# Patient Record
Sex: Male | Born: 1990 | Race: White | Hispanic: No | Marital: Single | Smoking: Former smoker
Health system: Southern US, Community
[De-identification: ages and names within clinical notes are randomized; demographics above are authoritative.]

## PROBLEM LIST (undated history)

## (undated) DIAGNOSIS — M109 Gout, unspecified: Secondary | ICD-10-CM

## (undated) HISTORY — DX: Gout, unspecified: M10.9

---

## 2015-05-01 DIAGNOSIS — M549 Dorsalgia, unspecified: Secondary | ICD-10-CM | POA: Insufficient documentation

## 2015-05-01 DIAGNOSIS — Z720 Tobacco use: Secondary | ICD-10-CM | POA: Insufficient documentation

## 2015-05-01 DIAGNOSIS — R1032 Left lower quadrant pain: Secondary | ICD-10-CM | POA: Diagnosis present

## 2015-05-01 DIAGNOSIS — N201 Calculus of ureter: Secondary | ICD-10-CM | POA: Insufficient documentation

## 2015-05-02 ENCOUNTER — Emergency Department (HOSPITAL_COMMUNITY): Payer: 59

## 2015-05-02 ENCOUNTER — Emergency Department (HOSPITAL_COMMUNITY)
Admission: EM | Admit: 2015-05-02 | Discharge: 2015-05-02 | Disposition: A | Discharge status: Home / Self Care | Payer: 59 | Attending: Emergency Medicine | Admitting: Emergency Medicine

## 2015-05-02 ENCOUNTER — Encounter (HOSPITAL_COMMUNITY): Payer: Self-pay | Admitting: *Deleted

## 2015-05-02 DIAGNOSIS — N201 Calculus of ureter: Secondary | ICD-10-CM

## 2015-05-02 LAB — URINALYSIS, ROUTINE W REFLEX MICROSCOPIC
Bilirubin Urine: NEGATIVE
GLUCOSE, UA: NEGATIVE mg/dL
Ketones, ur: 15 mg/dL — AB
LEUKOCYTES UA: NEGATIVE
Nitrite: NEGATIVE
PH: 5.5 (ref 5.0–8.0)
Protein, ur: NEGATIVE mg/dL
Specific Gravity, Urine: 1.02 (ref 1.005–1.030)
Urobilinogen, UA: 0.2 mg/dL (ref 0.0–1.0)

## 2015-05-02 LAB — URINE MICROSCOPIC-ADD ON

## 2015-05-02 LAB — CBC WITH DIFFERENTIAL/PLATELET
Basophils Absolute: 0 10*3/uL (ref 0.0–0.1)
Basophils Relative: 0 %
EOS PCT: 0 %
Eosinophils Absolute: 0 10*3/uL (ref 0.0–0.7)
HEMATOCRIT: 43.9 % (ref 39.0–52.0)
Hemoglobin: 15 g/dL (ref 13.0–17.0)
LYMPHS ABS: 1.5 10*3/uL (ref 0.7–4.0)
LYMPHS PCT: 17 %
MCH: 31.8 pg (ref 26.0–34.0)
MCHC: 34.2 g/dL (ref 30.0–36.0)
MCV: 93 fL (ref 78.0–100.0)
MONO ABS: 0.4 10*3/uL (ref 0.1–1.0)
MONOS PCT: 5 %
NEUTROS ABS: 6.9 10*3/uL (ref 1.7–7.7)
Neutrophils Relative %: 78 %
PLATELETS: 145 10*3/uL — AB (ref 150–400)
RBC: 4.72 MIL/uL (ref 4.22–5.81)
RDW: 12.6 % (ref 11.5–15.5)
WBC: 8.9 10*3/uL (ref 4.0–10.5)

## 2015-05-02 LAB — COMPREHENSIVE METABOLIC PANEL
ALBUMIN: 4.1 g/dL (ref 3.5–5.0)
ALT: 72 U/L — AB (ref 17–63)
AST: 63 U/L — AB (ref 15–41)
Alkaline Phosphatase: 43 U/L (ref 38–126)
Anion gap: 12 (ref 5–15)
BILIRUBIN TOTAL: 0.4 mg/dL (ref 0.3–1.2)
BUN: 9 mg/dL (ref 6–20)
CHLORIDE: 103 mmol/L (ref 101–111)
CO2: 24 mmol/L (ref 22–32)
CREATININE: 1.01 mg/dL (ref 0.61–1.24)
Calcium: 9.2 mg/dL (ref 8.9–10.3)
GFR calc Af Amer: >60 mL/min (ref >60)
GLUCOSE: 107 mg/dL — AB (ref 65–99)
POTASSIUM: 4.2 mmol/L (ref 3.5–5.1)
Sodium: 139 mmol/L (ref 135–145)
Total Protein: 7.4 g/dL (ref 6.5–8.1)

## 2015-05-02 LAB — LIPASE, BLOOD: LIPASE: 32 U/L (ref 11–51)

## 2015-05-02 MED ORDER — ONDANSETRON 8 MG PO TBDP: 8.0000 mg | ORAL_TABLET | Freq: Three times a day (TID) | ORAL | Status: DC | PRN

## 2015-05-02 MED ORDER — SODIUM CHLORIDE 0.9 % IV BOLUS (SEPSIS)
1000.0000 mL | Freq: Once | INTRAVENOUS | Status: AC
  Administered 2015-05-02: 1000 mL via INTRAVENOUS

## 2015-05-02 MED ORDER — OXYCODONE-ACETAMINOPHEN 5-325 MG PO TABS: 1.0000 | ORAL_TABLET | ORAL | Status: DC | PRN

## 2015-05-02 MED ORDER — TAMSULOSIN HCL 0.4 MG PO CAPS: 0.4000 mg | ORAL_CAPSULE | Freq: Every day | ORAL | Status: DC

## 2015-05-02 MED ORDER — KETOROLAC TROMETHAMINE 30 MG/ML IJ SOLN
30.0000 mg | Freq: Once | INTRAMUSCULAR | Status: AC
  Administered 2015-05-02: 30 mg via INTRAVENOUS
  Filled 2015-05-02: qty 1

## 2015-05-02 MED ORDER — ONDANSETRON HCL 4 MG/2ML IJ SOLN
4.0000 mg | Freq: Once | INTRAMUSCULAR | Status: AC
  Administered 2015-05-02: 4 mg via INTRAVENOUS
  Filled 2015-05-02: qty 2

## 2015-05-02 MED ORDER — HYDROMORPHONE HCL 1 MG/ML IJ SOLN
1.0000 mg | Freq: Once | INTRAMUSCULAR | Status: AC
  Administered 2015-05-02: 1 mg via INTRAVENOUS
  Filled 2015-05-02: qty 1

## 2015-05-02 NOTE — Discharge Instructions (Signed)
Kidney Stones Kidney stones (urolithiasis) are deposits that form inside your kidneys. The intense pain is caused by the stone moving through the urinary tract. When the stone moves, the ureter goes into spasm around the stone. The stone is usually passed in the urine.  CAUSES   A disorder that makes certain neck glands produce too much parathyroid hormone (primary hyperparathyroidism).  A buildup of uric acid crystals, similar to gout in your joints.  Narrowing (stricture) of the ureter.  A kidney obstruction present at birth (congenital obstruction).  Previous surgery on the kidney or ureters.  Numerous kidney infections. SYMPTOMS   Feeling sick to your stomach (nauseous).  Throwing up (vomiting).  Blood in the urine (hematuria).  Pain that usually spreads (radiates) to the groin.  Frequency or urgency of urination. DIAGNOSIS   Taking a history and physical exam.  Blood or urine tests.  CT scan.  Occasionally, an examination of the inside of the urinary bladder (cystoscopy) is performed. TREATMENT   Observation.  Increasing your fluid intake.  Extracorporeal shock wave lithotripsy--This is a noninvasive procedure that uses shock waves to break up kidney stones.  Surgery may be needed if you have severe pain or persistent obstruction. There are various surgical procedures. Most of the procedures are performed with the use of small instruments. Only small incisions are needed to accommodate these instruments, so recovery time is minimized. The size, location, and chemical composition are all important variables that will determine the proper choice of action for you. Talk to your health care provider to better understand your situation so that you will minimize the risk of injury to yourself and your kidney.  HOME CARE INSTRUCTIONS   Drink enough water and fluids to keep your urine clear or pale yellow. This will help you to pass the stone or stone fragments.  Strain  all urine through the provided strainer. Keep all particulate matter and stones for your health care provider to see. The stone causing the pain may be as small as a grain of salt. It is very important to use the strainer each and every time you pass your urine. The collection of your stone will allow your health care provider to analyze it and verify that a stone has actually passed. The stone analysis will often identify what you can do to reduce the incidence of recurrences.  Only take over-the-counter or prescription medicines for pain, discomfort, or fever as directed by your health care provider.  Keep all follow-up visits as told by your health care provider. This is important.  Get follow-up X-rays if required. The absence of pain does not always mean that the stone has passed. It may have only stopped moving. If the urine remains completely obstructed, it can cause loss of kidney function or even complete destruction of the kidney. It is your responsibility to make sure X-rays and follow-ups are completed. Ultrasounds of the kidney can show blockages and the status of the kidney. Ultrasounds are not associated with any radiation and can be performed easily in a matter of minutes.  Make changes to your daily diet as told by your health care provider. You may be told to:  Limit the amount of salt that you eat.  Eat 5 or more servings of fruits and vegetables each day.  Limit the amount of meat, poultry, fish, and eggs that you eat.  Collect a 24-hour urine sample as told by your health care provider.You may need to collect another urine sample every 6-12   months. SEEK MEDICAL CARE IF:  You experience pain that is progressive and unresponsive to any pain medicine you have been prescribed. SEEK IMMEDIATE MEDICAL CARE IF:   Pain cannot be controlled with the prescribed medicine.  You have a fever or shaking chills.  The severity or intensity of pain increases over 18 hours and is not  relieved by pain medicine.  You develop a new onset of abdominal pain.  You feel faint or pass out.  You are unable to urinate.   This information is not intended to replace advice given to you by your health care provider. Make sure you discuss any questions you have with your health care provider.   Document Released: 06/05/2005 Document Revised: 02/24/2015 Document Reviewed: 11/06/2012 Elsevier Interactive Patient Education 2016 [employer] Dietary Guidelines to Help Prevent Kidney Stones Your risk of kidney stones can be decreased by adjusting the foods you eat. The most important thing you can do is drink enough fluid. You should drink enough fluid to keep your urine clear or pale yellow. The following guidelines provide specific information for the type of kidney stone you have had. GUIDELINES ACCORDING TO TYPE OF KIDNEY STONE Calcium Oxalate Kidney Stones  Reduce the amount of salt you eat. Foods that have a lot of salt cause your body to release excess calcium into your urine. The excess calcium can combine with a substance called oxalate to form kidney stones.  Reduce the amount of animal protein you eat if the amount you eat is excessive. Animal protein causes your body to release excess calcium into your urine. Ask your dietitian how much protein from animal sources you should be eating.  Avoid foods that are high in oxalates. If you take vitamins, they should have less than 500 mg of vitamin C. Your body turns vitamin C into oxalates. You do not need to avoid fruits and vegetables high in vitamin C. Calcium Phosphate Kidney Stones  Reduce the amount of salt you eat to help prevent the release of excess calcium into your urine.  Reduce the amount of animal protein you eat if the amount you eat is excessive. Animal protein causes your body to release excess calcium into your urine. Ask your dietitian how much protein from animal sources you should be eating.  Get enough  calcium from food or take a calcium supplement (ask your dietitian for recommendations). Food sources of calcium that do not increase your risk of kidney stones include:  Broccoli.  Dairy products, such as cheese and yogurt.  Pudding. Uric Acid Kidney Stones  Do not have more than 6 oz of animal protein per day. FOOD SOURCES Animal Protein Sources  Meat (all types).  Poultry.  Eggs.  Fish, seafood. Foods High in Salt  Salt seasonings.  Soy sauce.  Teriyaki sauce.  Cured and processed meats.  Salted crackers and snack foods.  Fast food.  Canned soups and most canned foods. Foods High in Oxalates  Grains:  Amaranth.  Barley.  Grits.  Wheat germ.  Bran.  Buckwheat flour.  All bran cereals.  Pretzels.  Whole wheat bread.  Vegetables:  Beans (wax).  Beets and beet greens.  Collard greens.  Eggplant.  Escarole.  Leeks.  Okra.  Parsley.  Rutabagas.  Spinach.  Swiss chard.  Tomato paste.  Fried potatoes.  Sweet potatoes.  Fruits:  Red currants.  Figs.  Kiwi.  Rhubarb.  Meat and Other Protein Sources:  Beans (dried).  Soy burgers and other soybean products.  Miso.    Nuts (peanuts, almonds, pecans, cashews, hazelnuts).  Nut butters.  Sesame seeds and tahini (paste made of sesame seeds).  Poppy seeds.  Beverages:  Chocolate drink mixes.  Soy milk.  Instant iced tea.  Juices made from high-oxalate fruits or vegetables.  Other:  Carob.  Chocolate.  Fruitcake.  Marmalades.   This information is not intended to replace advice given to you by your health care provider. Make sure you discuss any questions you have with your health care provider.   Document Released: 09/30/2010 Document Revised: 06/10/2013 Document Reviewed: 05/02/2013 Elsevier Interactive Patient Education 2016 [employer] Inc[employer]  

## 2015-05-02 NOTE — ED Notes (Signed)
Pt called for triage x2, no answer. 

## 2015-05-02 NOTE — ED Provider Notes (Signed)
CSN: [id number]     Arrival date & time 05/01/15  2343 History   First MD Initiated Contact with Patient 05/02/15 0115     Chief Complaint  Patient presents with  . Abdominal Pain     (Consider location/radiation/quality/duration/timing/severity/associated sxs/prior Treatment) Patient is a 24 y.o. male presenting with abdominal pain. The history is provided by the patient. No language interpreter was used.  Abdominal Pain Pain location:  L flank and LLQ Pain quality: aching, pressure and sharp   Pain severity:  Severe Onset quality:  Sudden Duration:  7 hours Timing:  Constant Progression:  Waxing and waning Relieved by:  Nothing Associated symptoms: nausea and vomiting   Associated symptoms: no chills, no fever and no hematuria   Associated symptoms comment:  Pain that started in the LLQ abdomen and moved into left flank/lower back about 7 hours ago. The pain intensifies and decreases. No urinary symptoms of hematuria, or hesitation. No groin pain. No fever. He has nausea and vomiting with the worst pain. No history of kidney stones.    History reviewed. No pertinent past medical history. No past surgical history on file. No family history on file. Social History  Substance Use Topics  . Smoking status: Current Every Day Smoker  . Smokeless tobacco: None  . Alcohol Use: Yes    Review of Systems  Constitutional: Negative for fever and chills.  Gastrointestinal: Positive for nausea, vomiting and abdominal pain.  Genitourinary: Positive for flank pain. Negative for urgency, hematuria and testicular pain.  Musculoskeletal: Positive for back pain.  Skin: Negative.   Neurological: Negative.       Allergies  Review of patient's allergies indicates no known allergies.  Home Medications   Prior to Admission medications   Not on File   BP 131/95 mmHg  Pulse 70  Temp(Src) 98.2 F (36.8 C)  Resp 18  Ht  (1.88 m)  Wt 261 lb 4 oz (118.502 kg)  BMI 33.53 kg/m2   SpO2 98% Physical Exam  Constitutional: He is oriented to person, place, and time. He appears well-developed and well-nourished.  HENT:  Head: Normocephalic.  Neck: Normal range of motion. Neck supple.  Cardiovascular: Normal rate.   Pulmonary/Chest: Effort normal.  Abdominal: Soft. Bowel sounds are normal. There is no tenderness. There is no rebound and no guarding.  No reproducible tenderness of the abdomen.  Genitourinary:  No left CVA tenderness.   Musculoskeletal: Normal range of motion.  Neurological: He is alert and oriented to person, place, and time.  Skin: Skin is warm and dry. No rash noted.  Psychiatric: He has a normal mood and affect.    ED Course  Procedures (including critical care time) Labs Review Labs Reviewed  COMPREHENSIVE METABOLIC PANEL - Abnormal; Notable for the following:    Glucose, Bld 107 (*)    AST 63 (*)    ALT 72 (*)    All other components within normal limits  URINALYSIS, ROUTINE W REFLEX MICROSCOPIC (NOT AT [facility]) - Abnormal; Notable for the following:    APPearance CLOUDY (*)    Hgb urine dipstick LARGE (*)    Ketones, ur 15 (*)    All other components within normal limits  CBC WITH DIFFERENTIAL/PLATELET - Abnormal; Notable for the following:    Platelets 145 (*)    All other components within normal limits  URINE MICROSCOPIC-ADD ON - Abnormal; Notable for the following:    Squamous Epithelial / LPF FEW (*)    Bacteria, UA FEW (*)  All other components within normal limits  LIPASE, BLOOD   Results for orders placed or performed during the hospital encounter of 05/02/15  Lipase, blood  Result Value Ref Range   Lipase 32 11 - 51 U/L  Comprehensive metabolic panel  Result Value Ref Range   Sodium 139 135 - 145 mmol/L   Potassium 4.2 3.5 - 5.1 mmol/L   Chloride 103 101 - 111 mmol/L   CO2 24 22 - 32 mmol/L   Glucose, Bld 107 (H) 65 - 99 mg/dL   BUN 9 6 - 20 mg/dL   Creatinine, Ser 1.611.01 0.61 - 1.24 mg/dL   Calcium 9.2 8.9 - 09.610.3  mg/dL   Total Protein 7.4 6.5 - 8.1 g/dL   Albumin 4.1 3.5 - 5.0 g/dL   AST 63 (H) 15 - 41 U/L   ALT 72 (H) 17 - 63 U/L   Alkaline Phosphatase 43 38 - 126 U/L   Total Bilirubin 0.4 0.3 - 1.2 mg/dL   GFR calc non Af Amer >60 >60 mL/min   GFR calc Af Amer >60 >60 mL/min   Anion gap 12 5 - 15  Urinalysis, Routine w reflex microscopic (not at [facility])  Result Value Ref Range   Color, Urine YELLOW YELLOW   APPearance CLOUDY (A) CLEAR   Specific Gravity, Urine 1.020 1.005 - 1.030   pH 5.5 5.0 - 8.0   Glucose, UA NEGATIVE NEGATIVE mg/dL   Hgb urine dipstick LARGE (A) NEGATIVE   Bilirubin Urine NEGATIVE NEGATIVE   Ketones, ur 15 (A) NEGATIVE mg/dL   Protein, ur NEGATIVE NEGATIVE mg/dL   Urobilinogen, UA 0.2 0.0 - 1.0 mg/dL   Nitrite NEGATIVE NEGATIVE   Leukocytes, UA NEGATIVE NEGATIVE  CBC with Differential  Result Value Ref Range   WBC 8.9 4.0 - 10.5 K/uL   RBC 4.72 4.22 - 5.81 MIL/uL   Hemoglobin 15.0 13.0 - 17.0 g/dL   HCT 04.543.9 40.939.0 - 81.152.0 %   MCV 93.0 78.0 - 100.0 fL   MCH 31.8 26.0 - 34.0 pg   MCHC 34.2 30.0 - 36.0 g/dL   RDW 91.412.6 78.211.5 - 95.615.5 %   Platelets 145 (L) 150 - 400 K/uL   Neutrophils Relative % 78 %   Neutro Abs 6.9 1.7 - 7.7 K/uL   Lymphocytes Relative 17 %   Lymphs Abs 1.5 0.7 - 4.0 K/uL   Monocytes Relative 5 %   Monocytes Absolute 0.4 0.1 - 1.0 K/uL   Eosinophils Relative 0 %   Eosinophils Absolute 0.0 0.0 - 0.7 K/uL   Basophils Relative 0 %   Basophils Absolute 0.0 0.0 - 0.1 K/uL  Urine microscopic-add on  Result Value Ref Range   Squamous Epithelial / LPF FEW (A) RARE   WBC, UA 0-2 <3 WBC/hpf   RBC / HPF TOO NUMEROUS TO COUNT <3 RBC/hpf   Bacteria, UA FEW (A) RARE   Urine-Other MUCOUS PRESENT    Ct Renal Stone Study  05/02/2015  CLINICAL DATA:  Left-sided abdominal pain radiating to the back EXAM: CT ABDOMEN AND PELVIS WITHOUT CONTRAST TECHNIQUE: Multidetector CT imaging of the abdomen and pelvis was performed following the standard protocol without IV  contrast. COMPARISON:  None. FINDINGS: Lower chest and abdominal wall: Mild fatty enlargement of the left inguinal canal, likely shallow hernia. Hepatobiliary: No focal liver abnormality.No evidence of biliary obstruction or stone. Pancreas: Unremarkable. Spleen: Unremarkable. Adrenals/Urinary Tract: Negative adrenals. Mild left hydronephrosis and upper hydroureter with perinephric edema and left renal expansion secondary to  a 4 mm mid ureteral stone (near the L3-4 disc level). No right hydronephrosis or additional urolithiasis. Unremarkable bladder. Reproductive:No pathologic findings. Stomach/Bowel:  No obstruction. No appendicitis. Vascular/Lymphatic: No acute vascular abnormality. No mass or adenopathy. Peritoneal: No ascites or pneumoperitoneum. Musculoskeletal: No acute abnormalities. IMPRESSION: 4 mm stone in the mid left ureter with mild hydronephrosis. Electronically Signed   By: Marnee Spring M.D.   On: 05/02/2015 02:29    Imaging Review No results found. I have personally reviewed and evaluated these images and lab results as part of my medical decision-making.   EKG Interpretation None      MDM   Final diagnoses:  None    1. Left ureteral stone  Pain is resolved with pain medication x 1. No further vomiting.   4 mm stone mid way down left ureter. Discussed care at home, return precautions prompting return to ED. Will provide pain relief, Zofran and Flomax. Refer to urology.    Elpidio Anis, PA-C 05/02/15 0249  Dione Booze, MD 05/02/15 [phone]

## 2015-05-02 NOTE — ED Notes (Signed)
The pt is c/o abd pain with nv for 6 hours

## 2015-10-29 ENCOUNTER — Emergency Department (HOSPITAL_BASED_OUTPATIENT_CLINIC_OR_DEPARTMENT_OTHER)
Admission: EM | Admit: 2015-10-29 | Discharge: 2015-10-29 | Disposition: A | Discharge status: Home / Self Care | Payer: 59 | Attending: Emergency Medicine | Admitting: Emergency Medicine

## 2015-10-29 ENCOUNTER — Encounter (HOSPITAL_BASED_OUTPATIENT_CLINIC_OR_DEPARTMENT_OTHER): Payer: Self-pay | Admitting: Emergency Medicine

## 2015-10-29 DIAGNOSIS — Y999 Unspecified external cause status: Secondary | ICD-10-CM | POA: Insufficient documentation

## 2015-10-29 DIAGNOSIS — T23102A Burn of first degree of left hand, unspecified site, initial encounter: Secondary | ICD-10-CM | POA: Insufficient documentation

## 2015-10-29 DIAGNOSIS — Y92009 Unspecified place in unspecified non-institutional (private) residence as the place of occurrence of the external cause: Secondary | ICD-10-CM | POA: Diagnosis not present

## 2015-10-29 DIAGNOSIS — X150XXA Contact with hot stove (kitchen), initial encounter: Secondary | ICD-10-CM | POA: Diagnosis not present

## 2015-10-29 DIAGNOSIS — F17200 Nicotine dependence, unspecified, uncomplicated: Secondary | ICD-10-CM | POA: Insufficient documentation

## 2015-10-29 DIAGNOSIS — Y9389 Activity, other specified: Secondary | ICD-10-CM | POA: Insufficient documentation

## 2015-10-29 MED ORDER — NAPROXEN 250 MG PO TABS
500.0000 mg | ORAL_TABLET | Freq: Once | ORAL | Status: AC
  Administered 2015-10-29: 500 mg via ORAL
  Filled 2015-10-29: qty 2

## 2015-10-29 MED ORDER — HYDROCODONE-ACETAMINOPHEN 5-325 MG PO TABS: 1.0000 | ORAL_TABLET | Freq: Four times a day (QID) | ORAL | Status: DC | PRN

## 2015-10-29 MED ORDER — HYDROCODONE-ACETAMINOPHEN 5-325 MG PO TABS
2.0000 | ORAL_TABLET | Freq: Once | ORAL | Status: AC
  Administered 2015-10-29: 2 via ORAL
  Filled 2015-10-29: qty 2
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
## 2015-10-29 NOTE — ED Notes (Signed)
MD at bedside. 

## 2017-02-15 IMAGING — CT CT RENAL STONE PROTOCOL
2 of 4 series · 9 of 46 positions shown, 10 images · non-contrast
Comparison: None.

CLINICAL DATA: Left-sided abdominal pain radiating to the back

EXAM:
CT ABDOMEN AND PELVIS WITHOUT CONTRAST
TECHNIQUE: Multidetector CT imaging of the abdomen and pelvis was performed
following the standard protocol without IV contrast.

[Series 201: stone study, idose (2) · axial · 0.91mm/px · z∈[+121,+536]mm · 6 of 101 slices shown, 7 images]
[im 9/101  soft-tissue]
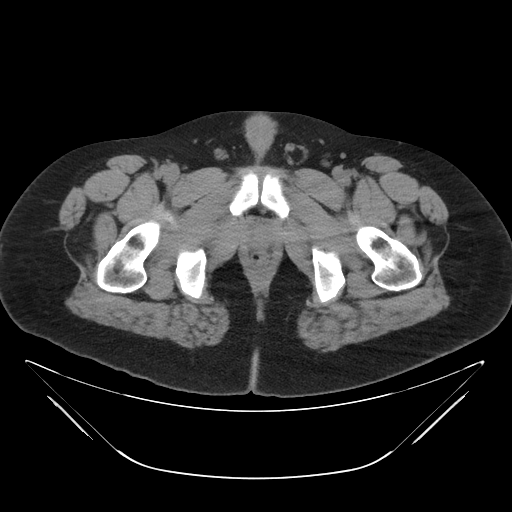
[im 9/101  bone]
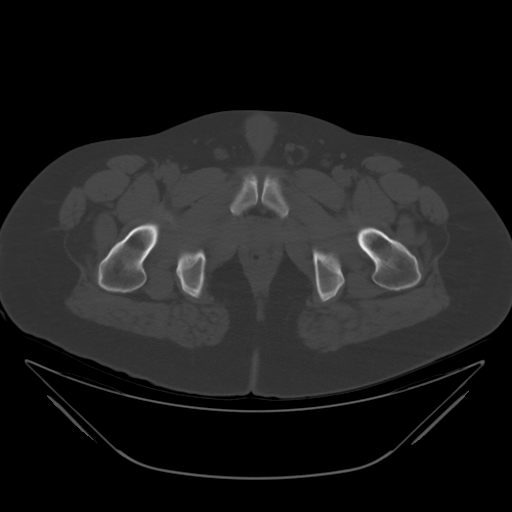
[im 27/101  soft-tissue]
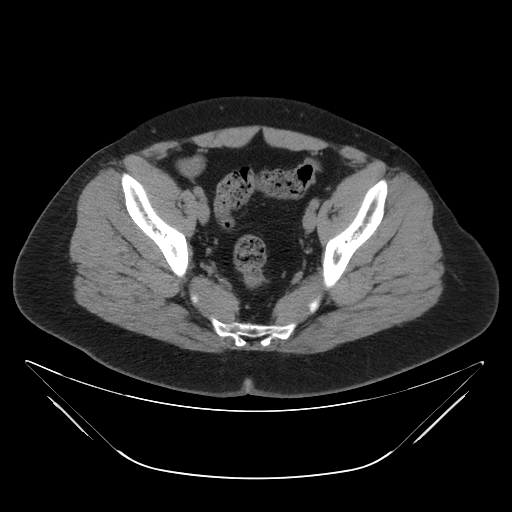
[im 44/101  soft-tissue]
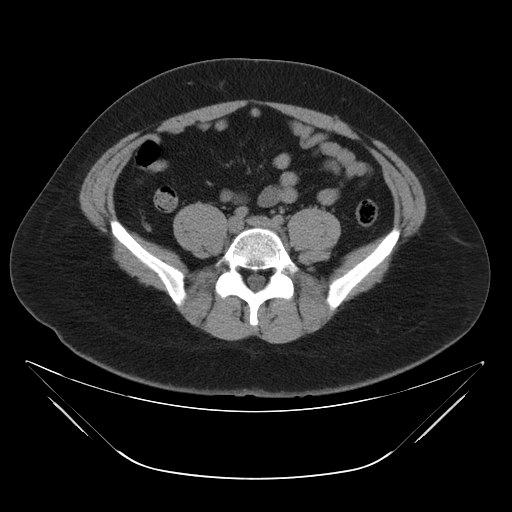
[im 57/101  soft-tissue]
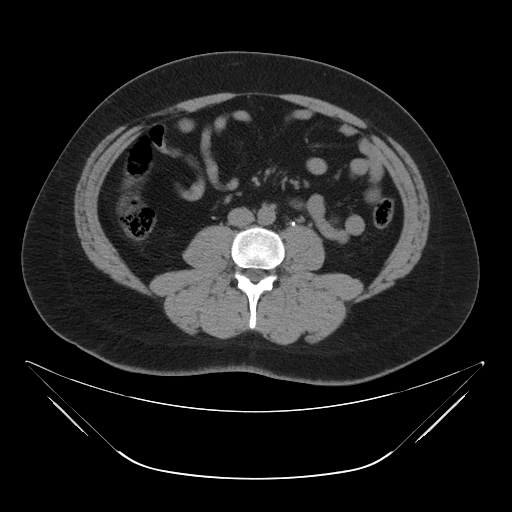
[im 74/101  soft-tissue]
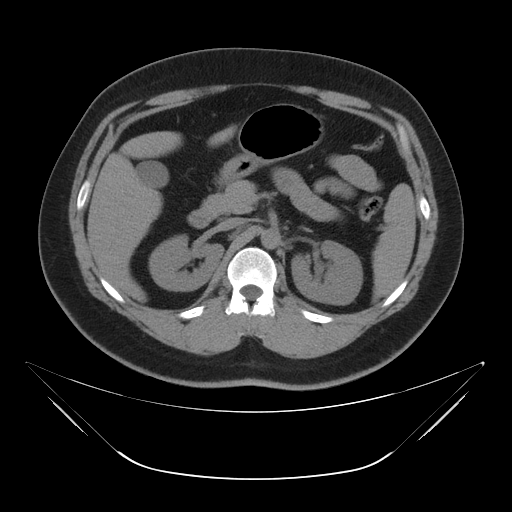
[im 92/101  soft-tissue]
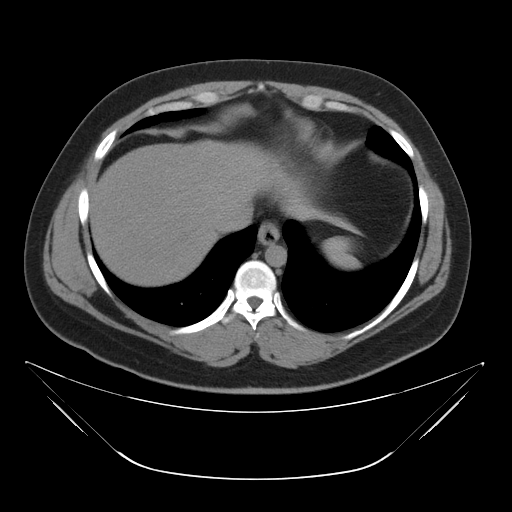

[Series 203: coronals, idose (2) · coronal · 0.45mm/px · 3 of 144 slices shown]
[im 48/144  soft-tissue]
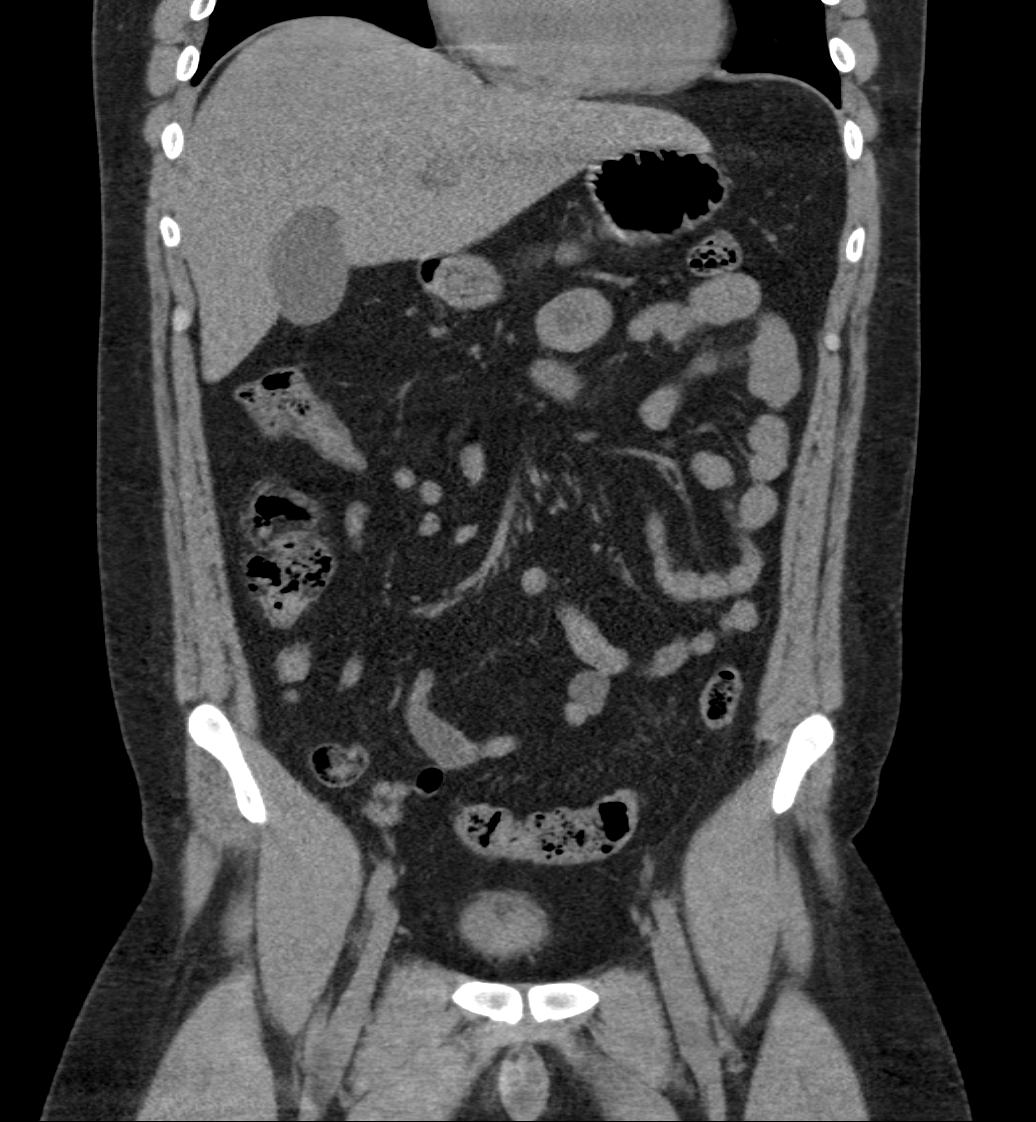
[im 64/144  soft-tissue]
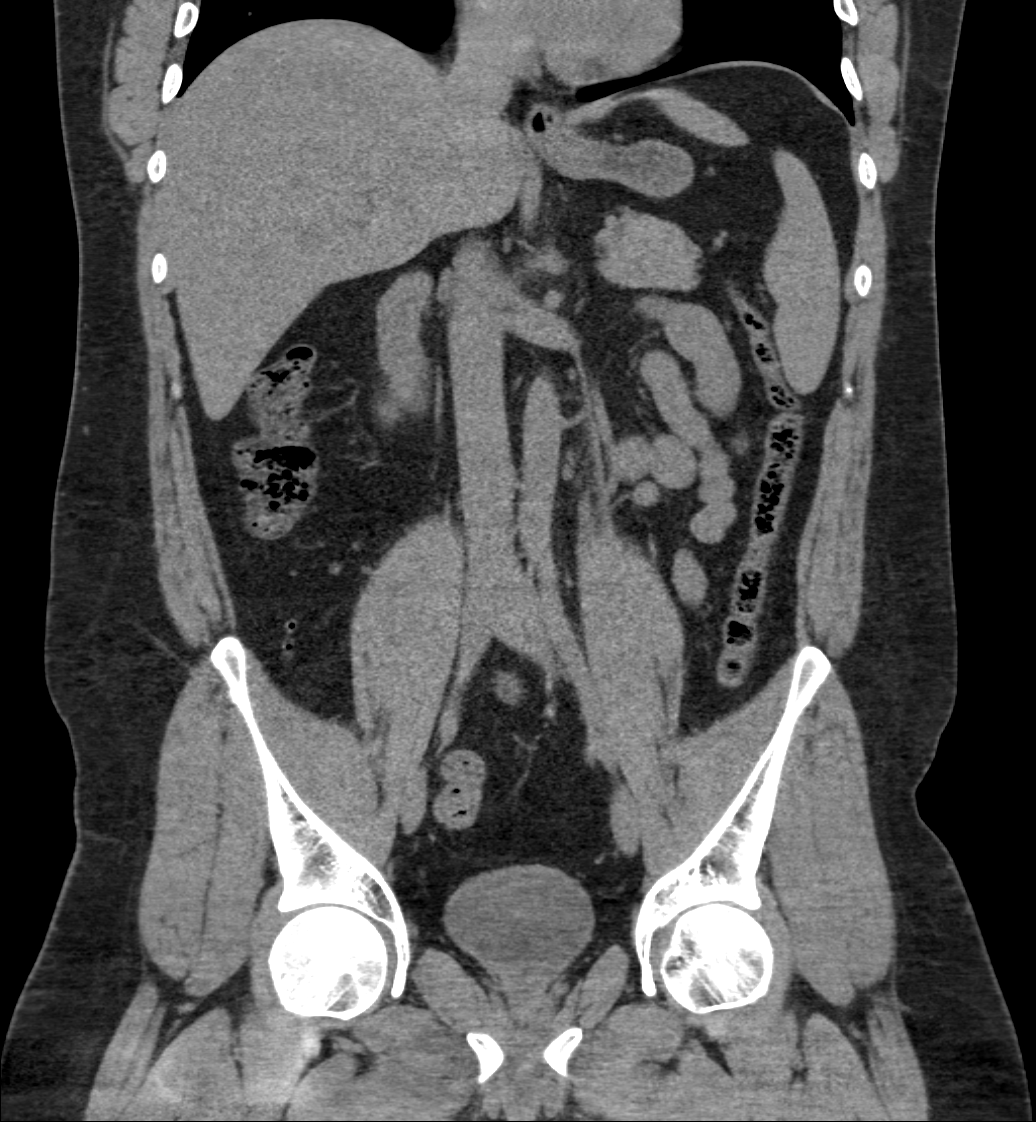
[im 80/144  soft-tissue]
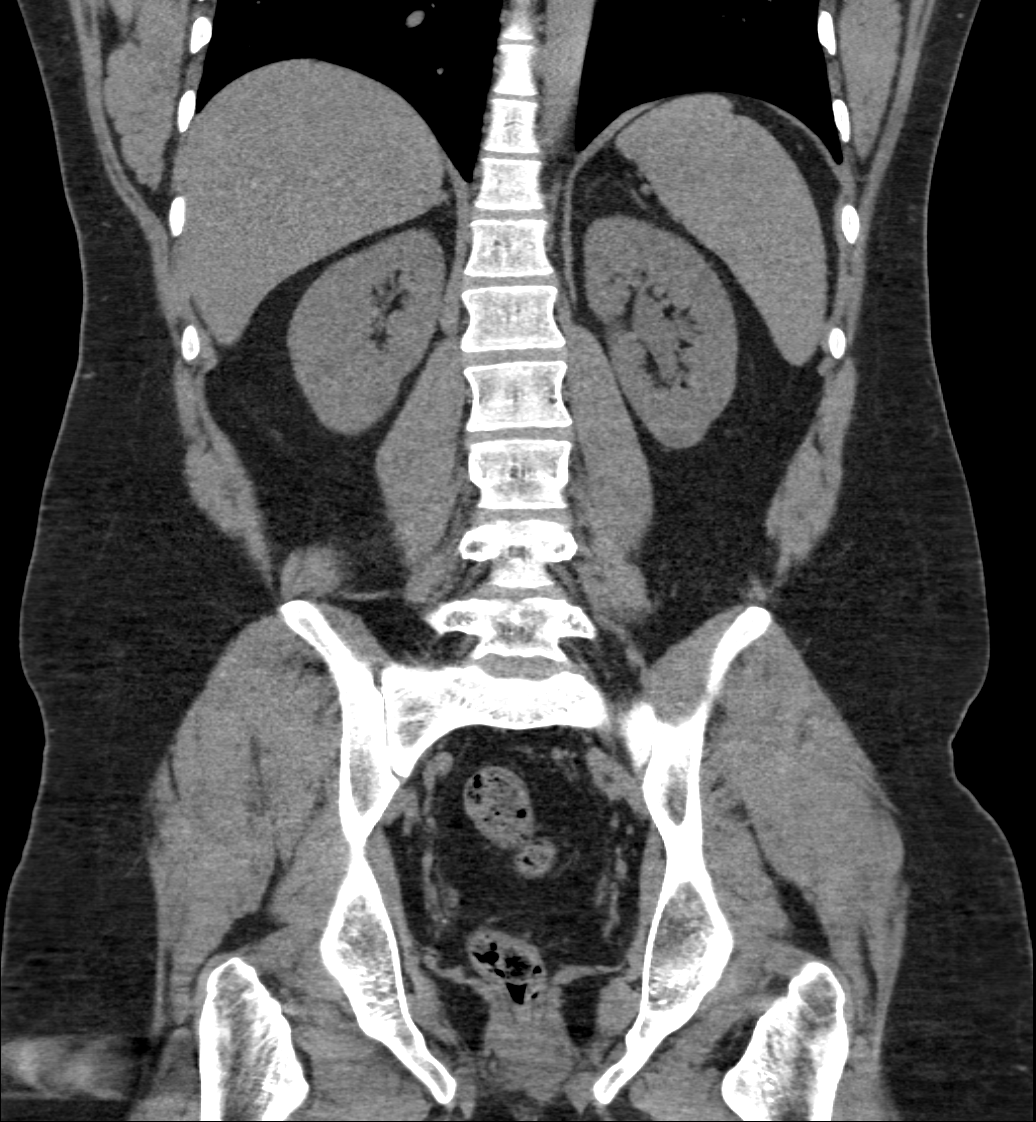

[9 of 46 positions shown; findings below may reference images not displayed]

FINDINGS: Lower chest and abdominal wall: Mild fatty enlargement of the left
inguinal canal, likely shallow hernia.

Hepatobiliary: No focal liver abnormality.No evidence of biliary
obstruction or stone.

Pancreas: Unremarkable.

Spleen: Unremarkable.

Adrenals/Urinary Tract: Negative adrenals. Mild left hydronephrosis
and upper hydroureter with perinephric edema and left renal
expansion secondary to a 4 mm mid ureteral stone (near the L3-4 disc
level). No right hydronephrosis or additional urolithiasis.
Unremarkable bladder.

Reproductive:No pathologic findings.

Stomach/Bowel:  No obstruction. No appendicitis.

Vascular/Lymphatic: No acute vascular abnormality. No mass or
adenopathy.

Peritoneal: No ascites or pneumoperitoneum.

Musculoskeletal: No acute abnormalities.
IMPRESSION: 4 mm stone in the mid left ureter with mild hydronephrosis.

## 2024-06-30 ENCOUNTER — Ambulatory Visit
Admission: RE | Admit: 2024-06-30 | Discharge: 2024-06-30 | Disposition: A | Source: Ambulatory Visit | Attending: Internal Medicine | Admitting: Internal Medicine

## 2024-06-30 ENCOUNTER — Ambulatory Visit
Admission: RE | Admit: 2024-06-30 | Discharge: 2024-06-30 | Disposition: A | Discharge status: Home / Self Care | Attending: Internal Medicine | Admitting: Internal Medicine

## 2024-06-30 ENCOUNTER — Encounter: Payer: Self-pay | Admitting: Internal Medicine

## 2024-06-30 ENCOUNTER — Ambulatory Visit: Payer: Self-pay | Admitting: Internal Medicine

## 2024-06-30 VITALS — BP 142/78 | HR 82 | Temp 97.8°F | Resp 16 | Ht 75.0 in | Wt 264.0 lb

## 2024-06-30 DIAGNOSIS — Z6833 Body mass index (BMI) 33.0-33.9, adult: Secondary | ICD-10-CM

## 2024-06-30 DIAGNOSIS — G8929 Other chronic pain: Secondary | ICD-10-CM

## 2024-06-30 DIAGNOSIS — R748 Abnormal levels of other serum enzymes: Secondary | ICD-10-CM

## 2024-06-30 DIAGNOSIS — R030 Elevated blood-pressure reading, without diagnosis of hypertension: Secondary | ICD-10-CM | POA: Diagnosis not present

## 2024-06-30 DIAGNOSIS — E66811 Obesity, class 1: Secondary | ICD-10-CM

## 2024-06-30 DIAGNOSIS — M25561 Pain in right knee: Secondary | ICD-10-CM

## 2024-06-30 MED ORDER — COLCHICINE 0.6 MG PO TABS: 0.6000 mg | ORAL_TABLET | Freq: Two times a day (BID) | ORAL | 1 refills | Status: AC

## 2024-06-30 MED ORDER — ALLOPURINOL 300 MG PO TABS: 300.0000 mg | ORAL_TABLET | Freq: Every day | ORAL | 1 refills | Status: DC

## 2024-06-30 NOTE — Progress Notes (Signed)
 [facility] [address] [place], [place] [address]  Internal MEDICINE  Office Visit Note  Patient Name: Ronald Mcpherson  [id number]  [id number]  Date of Service: 06/30/2024   Complaints/HPI Pt is here for establishment of PCP. Chief Complaint  Patient presents with   New Patient (Initial Visit)   HPI Pt is seen for establishment of PCP Patient is complaining of right knee pain, swelling and stiffness. This is going on since Christmas.  Been seen by anyone has been taking his colchicine  and allopurinol  since he does have a history of gout He said he has been in the bed most of the time Denies any fever or chills Patient consumes alcohol on a regular basis has history of elevated transaminases and glucose in the past Current Medication: Outpatient Encounter Medications as of 06/30/2024  Medication Sig   [DISCONTINUED] allopurinol  (ZYLOPRIM ) 300 MG tablet Take 300 mg by mouth daily.   [DISCONTINUED] colchicine  0.6 MG tablet Take 0.6 mg by mouth.   allopurinol  (ZYLOPRIM ) 300 MG tablet Take 1 tablet (300 mg total) by mouth daily.   colchicine  0.6 MG tablet Take 1 tablet (0.6 mg total) by mouth 2 (two) times daily.   [DISCONTINUED] HYDROcodone -acetaminophen  (NORCO) 5-325 MG tablet Take 1-2 tablets by mouth every 6 (six) hours as needed (for pain).   No facility-administered encounter medications on file as of 06/30/2024.    Surgical History: No past surgical history on file.  Medical History: Past Medical History:  Diagnosis Date   Gout     Family History: Family History  Problem Relation Age of Onset   Hypertension Father     Social History   Socioeconomic History   Marital status: Single    Spouse name: Not on file   Number of children: Not on file   Years of education: Not on file   Highest education level: Not on file  Occupational History   Not on file  Tobacco Use   Smoking status: Former    Types: Cigarettes   Smokeless tobacco: Not on file    Tobacco comments:    Stopped 13 years ago used 1 cigarettes day  Substance and Sexual Activity   Alcohol use: Yes   Drug use: Never   Sexual activity: Not on file  Other Topics Concern   Not on file  Social History Narrative   Not on file   Social Drivers of Health   Tobacco Use: Medium Risk (06/30/2024)   Patient History    Smoking Tobacco Use: Former    Smokeless Tobacco Use: Unknown    Passive Exposure: Not on file  Financial Resource Strain: Low Risk (06/26/2022)   Received from [facility]   Overall Financial Resource Strain (CARDIA)    Difficulty of Paying Living Expenses: Not hard at all  Food Insecurity: No Food Insecurity (06/26/2022)   Received from [facility]   Epic    Within the past 12 months, you worried that your food would run out before you got the money to buy more.: Never true    Within the past 12 months, the food you bought just didn't last and you didn't have money to get more.: Never true  Transportation Needs: No Transportation Needs (06/26/2022)   Received from [facility]   PRAPARE - Transportation    Lack of Transportation (Medical): No    Lack of Transportation (Non-Medical): No  Physical Activity: Not on file  Stress: Not on file  Social Connections: Not on file  Intimate Partner Violence: Not on file  Depression (PHQ2-9): Low Risk (06/30/2024)   Depression (PHQ2-9)    PHQ-2 Score: 0  Alcohol Screen: Not on file  Housing: Not on file  Utilities: Not on file  Health Literacy: Not on file     Review of Systems  Constitutional:  Negative for fatigue and fever.  HENT:  Negative for congestion, mouth sores and postnasal drip.   Respiratory:  Negative for cough.   Cardiovascular:  Negative for chest pain.  Genitourinary:  Negative for flank pain.  Musculoskeletal:  Positive for arthralgias, gait problem and joint swelling.  Psychiatric/Behavioral: Negative.      Vital Signs: BP (!) 142/78   Pulse 82   Temp 97.8 F (36.6 C)    Resp 16   Ht 6' 3 (1.905 m)   Wt 264 lb (119.7 kg)   SpO2 98%   BMI 33.00 kg/m    Physical Exam Constitutional:      Appearance: Normal appearance.  HENT:     Head: Normocephalic and atraumatic.     Nose: Nose normal.     Mouth/Throat:     Mouth: Mucous membranes are moist.     Pharynx: No posterior oropharyngeal erythema.  Eyes:     Extraocular Movements: Extraocular movements intact.     Pupils: Pupils are equal, round, and reactive to light.  Cardiovascular:     Pulses: Normal pulses.     Heart sounds: Normal heart sounds.  Pulmonary:     Effort: Pulmonary effort is normal.     Breath sounds: Normal breath sounds.  Musculoskeletal:        General: Swelling and deformity present.     Comments: RIGHT KNEE??  effusion   Neurological:     General: No focal deficit present.     Mental Status: He is alert.  Psychiatric:        Mood and Affect: Mood normal.        Behavior: Behavior normal.       Assessment/Plan: 1. Chronic pain of right knee (Primary) Will order x-ray review his previous prescriptions however will change medication depending on the lab work - DG Knee Complete 4 Views Right; Future - CBC with Differential/Platelet - CYCLIC CITRUL PEPTIDE ANTIBODY, IGG/IGA - Uric acid - Sed Rate (ESR)  2. Abnormal transaminases Update lab work - Comprehensive metabolic panel with GFR  3. Elevated blood pressure reading Blood pressure is elevated will monitor - Lipid Panel With LDL/HDL Ratio - TSH - T4, free   4. Class 1 obesity without serious comorbidity with body mass index (BMI) of 33.0 to 33.9 in adult, unspecified obesity type Will monitor for now and discuss a plan of treatment in future visits General Counseling: Eunice verbalizes understanding of the findings of todays visit and agrees with plan of treatment. I have discussed any further diagnostic evaluation that may be needed or ordered today. We also reviewed his medications today. he has been  encouraged to call the office with any questions or concerns that should arise related to todays visit.    Counseling:   Controlled Substance Database was reviewed by me.  Orders Placed This Encounter  Procedures   DG Knee Complete 4 Views Right   CBC with Differential/Platelet   Lipid Panel With LDL/HDL Ratio   TSH   T4, free   Comprehensive metabolic panel with GFR   CYCLIC CITRUL PEPTIDE ANTIBODY, IGG/IGA   Uric acid   Sed Rate (ESR)    Meds ordered this  encounter  Medications   colchicine  0.6 MG tablet    Sig: Take 1 tablet (0.6 mg total) by mouth 2 (two) times daily.    Dispense:  45 tablet    Refill:  1   allopurinol  (ZYLOPRIM ) 300 MG tablet    Sig: Take 1 tablet (300 mg total) by mouth daily.    Dispense:  90 tablet    Refill:  1    Time spent:45 Minutes

## 2024-07-01 ENCOUNTER — Other Ambulatory Visit: Payer: Self-pay

## 2024-07-01 MED ORDER — ALLOPURINOL 300 MG PO TABS: 300.0000 mg | ORAL_TABLET | Freq: Every day | ORAL | 1 refills | Status: AC

## 2024-07-01 NOTE — Telephone Encounter (Signed)
 Patient is requesting the following refill Requested Prescriptions   Pending Prescriptions Disp Refills   allopurinol  (ZYLOPRIM ) 300 MG tablet [Pharmacy Med Name: ALLOPURINOL  300 MG TABLET] 30 tablet 0    Sig: TAKE 1 TABLET BY MOUTH EVERY DAY    Recent Visits No visits were found meeting these conditions. Showing recent visits within past 365 days and meeting all other requirements Future Appointments No visits were found meeting these conditions. Showing future appointments within next 365 days and meeting all other requirements    Labs: Creatinine: No results found for: CREATININE

## 2024-07-02 ENCOUNTER — Telehealth: Payer: Self-pay | Admitting: Internal Medicine

## 2024-07-02 ENCOUNTER — Ambulatory Visit: Payer: Self-pay | Admitting: Internal Medicine

## 2024-07-02 NOTE — Telephone Encounter (Signed)
 Per dfk request, I schedule follow up appointment with patient to review lab results. Explained to him we do not have knee x-ray results yet.  Received call later in day from patient's fiance, stating patient does not need appointment for lab review. They can see results in his mychart and understand results. She said they do want the results for x-ray, and doesn't understand what the hold up is. I explained to her, that we have not received results yet. She then asked what the turn around time is supposed to be, I explained that I do not have that information and we will call once we get the results.]    While entering my note, fiance calls back again stating she called and radiologist told her if we call them to expedite results, they will. She stated patient needs results this week and note to return to work. I s/w [facility], she gave the okay for me to call to get results expedited.I left vm w/ [facility] to return my call.

## 2024-07-02 NOTE — Progress Notes (Signed)
 Add on b12 and folic acid please

## 2024-07-03 LAB — CBC WITH DIFFERENTIAL/PLATELET
Basophils Absolute: 0 x10E3/uL (ref 0.0–0.2)
Basos: 0 %
EOS (ABSOLUTE): 0 x10E3/uL (ref 0.0–0.4)
Eos: 0 %
Hematocrit: 43.6 % (ref 37.5–51.0)
Hemoglobin: 14.6 g/dL (ref 13.0–17.7)
Immature Grans (Abs): 0 x10E3/uL (ref 0.0–0.1)
Immature Granulocytes: 0 %
Lymphocytes Absolute: 1.2 x10E3/uL (ref 0.7–3.1)
Lymphs: 15 %
MCH: 34.1 pg — ABNORMAL HIGH (ref 26.6–33.0)
MCHC: 33.5 g/dL (ref 31.5–35.7)
MCV: 102 fL — ABNORMAL HIGH (ref 79–97)
Monocytes Absolute: 0.5 x10E3/uL (ref 0.1–0.9)
Monocytes: 6 %
Neutrophils Absolute: 6.3 x10E3/uL (ref 1.4–7.0)
Neutrophils: 78 %
Platelets: 212 x10E3/uL (ref 150–450)
RBC: 4.28 x10E6/uL (ref 4.14–5.80)
RDW: 14.2 % (ref 11.6–15.4)
WBC: 8.1 x10E3/uL (ref 3.4–10.8)

## 2024-07-03 LAB — COMPREHENSIVE METABOLIC PANEL WITH GFR
ALT: 50 IU/L — ABNORMAL HIGH (ref 0–44)
AST: 89 IU/L — ABNORMAL HIGH (ref 0–40)
Albumin: 4.7 g/dL (ref 4.1–5.1)
Alkaline Phosphatase: 58 IU/L (ref 47–123)
BUN/Creatinine Ratio: 6 — ABNORMAL LOW (ref 9–20)
BUN: 5 mg/dL — ABNORMAL LOW (ref 6–20)
Bilirubin Total: 0.5 mg/dL (ref 0.0–1.2)
CO2: 19 mmol/L — ABNORMAL LOW (ref 20–29)
Calcium: 9.7 mg/dL (ref 8.7–10.2)
Chloride: 97 mmol/L (ref 96–106)
Creatinine, Ser: 0.85 mg/dL (ref 0.76–1.27)
Globulin, Total: 2.7 g/dL (ref 1.5–4.5)
Glucose: 92 mg/dL (ref 70–99)
Potassium: 4 mmol/L (ref 3.5–5.2)
Sodium: 137 mmol/L (ref 134–144)
Total Protein: 7.4 g/dL (ref 6.0–8.5)
eGFR: 118 mL/min/1.73

## 2024-07-03 LAB — URIC ACID: Uric Acid: 7.3 mg/dL (ref 3.8–8.4)

## 2024-07-03 LAB — SEDIMENTATION RATE: Sed Rate: 17 mm/h — ABNORMAL HIGH (ref 0–15)

## 2024-07-03 LAB — LIPID PANEL WITH LDL/HDL RATIO
Cholesterol, Total: 244 mg/dL — ABNORMAL HIGH (ref 100–199)
HDL: 77 mg/dL
LDL Chol Calc (NIH): 103 mg/dL — ABNORMAL HIGH (ref 0–99)
LDL/HDL Ratio: 1.3 ratio (ref 0.0–3.6)
Triglycerides: 387 mg/dL — ABNORMAL HIGH (ref 0–149)
VLDL Cholesterol Cal: 64 mg/dL — ABNORMAL HIGH (ref 5–40)

## 2024-07-03 LAB — CYCLIC CITRUL PEPTIDE ANTIBODY, IGG/IGA: Cyclic Citrullin Peptide Ab: <1 U (ref 0–19)

## 2024-07-03 LAB — T4, FREE: Free T4: 0.95 ng/dL (ref 0.82–1.77)

## 2024-07-03 LAB — TSH: TSH: 2.76 u[IU]/mL (ref 0.450–4.500)

## 2024-07-03 NOTE — Telephone Encounter (Signed)
-----   Message from Sigrid Bathe, MD sent at 07/02/2024  4:29 PM EST ----- Add on b12 and folic acid please

## 2024-07-03 NOTE — Telephone Encounter (Signed)
 Added tests

## 2024-07-03 NOTE — Progress Notes (Signed)
 I have reviewed all the lab results. There are some abnormalities that are not critical to the patient's health, but I would like to discuss these in person at an office appointment. Please ask him to schedule a follow up visit with me at his convenience.  Abnormal LFTs, needs further work up Abnormal Lipid profile

## 2024-07-03 NOTE — Progress Notes (Signed)
 Small effusion above the knee, will resolve with time

## 2024-07-04 ENCOUNTER — Telehealth: Payer: Self-pay

## 2024-07-04 LAB — B12 AND FOLATE PANEL
Folate: 11.9 ng/mL
Vitamin B-12: 173 pg/mL — ABNORMAL LOW (ref 232–1245)

## 2024-07-04 LAB — SPECIMEN STATUS REPORT

## 2024-07-04 NOTE — Telephone Encounter (Signed)
 Spoke with patient's spouse, Rosina, regarding patient's knee x-ray and lab results. Advised patient's spouse to have patient use the RICE method for knee pain. Patient also on appropriate medications for his gout. Patient will follow-up with [facility] in May.

## 2024-07-08 NOTE — Telephone Encounter (Signed)
-----   Message from Sigrid Bathe, MD sent at 07/07/2024 10:25 AM EST ----- Low B12 level, needs b12 injections, can supplement OTC, will need to have a (PA)  follow up to discuss

## 2024-07-08 NOTE — Telephone Encounter (Signed)
 Patient's fiance notified of additional lab results, patient denied B12 injection offer, will supplement OTC.

## 2024-07-15 ENCOUNTER — Ambulatory Visit: Admitting: Internal Medicine

## 2024-11-04 ENCOUNTER — Ambulatory Visit: Admitting: Internal Medicine

## 2024-11-06 ENCOUNTER — Ambulatory Visit: Admitting: Physician Assistant
